# Patient Record
Sex: Male | Born: 1975 | Race: Black or African American | Hispanic: No | Marital: Single | Smoking: Current every day smoker
Health system: Southern US, Community
[De-identification: ages and names within clinical notes are randomized; demographics above are authoritative.]

---

## 2003-05-19 ENCOUNTER — Emergency Department (HOSPITAL_COMMUNITY): Admission: EM | Admit: 2003-05-19 | Discharge: 2003-05-19 | Payer: Self-pay | Admitting: Emergency Medicine

## 2004-02-28 ENCOUNTER — Emergency Department (HOSPITAL_COMMUNITY): Admission: AC | Admit: 2004-02-28 | Discharge: 2004-02-28 | Payer: Self-pay
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
## 2013-11-09 ENCOUNTER — Encounter (HOSPITAL_COMMUNITY): Payer: Self-pay | Admitting: Emergency Medicine

## 2013-11-09 ENCOUNTER — Emergency Department (HOSPITAL_COMMUNITY)
Admission: EM | Admit: 2013-11-09 | Discharge: 2013-11-10 | Disposition: A | Discharge status: Home / Self Care | Payer: Medicaid - Out of State | Attending: Emergency Medicine | Admitting: Emergency Medicine

## 2013-11-09 DIAGNOSIS — R112 Nausea with vomiting, unspecified: Secondary | ICD-10-CM | POA: Insufficient documentation

## 2013-11-09 DIAGNOSIS — F1010 Alcohol abuse, uncomplicated: Secondary | ICD-10-CM | POA: Insufficient documentation

## 2013-11-09 DIAGNOSIS — K2970 Gastritis, unspecified, without bleeding: Secondary | ICD-10-CM

## 2013-11-09 DIAGNOSIS — K2990 Gastroduodenitis, unspecified, without bleeding: Principal | ICD-10-CM

## 2013-11-09 DIAGNOSIS — F17200 Nicotine dependence, unspecified, uncomplicated: Secondary | ICD-10-CM | POA: Insufficient documentation

## 2013-11-09 LAB — URINALYSIS, ROUTINE W REFLEX MICROSCOPIC
Bilirubin Urine: NEGATIVE
GLUCOSE, UA: NEGATIVE mg/dL
KETONES UR: NEGATIVE mg/dL
Nitrite: NEGATIVE
PH: 6.5 (ref 5.0–8.0)
Protein, ur: NEGATIVE mg/dL
SPECIFIC GRAVITY, URINE: 1.025 (ref 1.005–1.030)
Urobilinogen, UA: 0.2 mg/dL (ref 0.0–1.0)

## 2013-11-09 LAB — COMPREHENSIVE METABOLIC PANEL
ALT: 23 U/L (ref 0–53)
AST: 27 U/L (ref 0–37)
Albumin: 4.5 g/dL (ref 3.5–5.2)
Alkaline Phosphatase: 49 U/L (ref 39–117)
BUN: 16 mg/dL (ref 6–23)
CO2: 23 mEq/L (ref 19–32)
Calcium: 9.9 mg/dL (ref 8.4–10.5)
Chloride: 94 mEq/L — ABNORMAL LOW (ref 96–112)
Creatinine, Ser: 1.06 mg/dL (ref 0.50–1.35)
GFR calc Af Amer: >90 mL/min (ref >90)
GFR calc non Af Amer: 89 mL/min — ABNORMAL LOW (ref >90)
Glucose, Bld: 134 mg/dL — ABNORMAL HIGH (ref 70–99)
Potassium: 3.4 mEq/L — ABNORMAL LOW (ref 3.7–5.3)
Sodium: 135 mEq/L — ABNORMAL LOW (ref 137–147)
Total Bilirubin: 0.8 mg/dL (ref 0.3–1.2)
Total Protein: 8.5 g/dL — ABNORMAL HIGH (ref 6.0–8.3)

## 2013-11-09 LAB — CBC WITH DIFFERENTIAL/PLATELET
BASOS ABS: 0 10*3/uL (ref 0.0–0.1)
BASOS PCT: 0 % (ref 0–1)
Eosinophils Absolute: 0.1 10*3/uL (ref 0.0–0.7)
Eosinophils Relative: 1 % (ref 0–5)
HEMATOCRIT: 46.1 % (ref 39.0–52.0)
Hemoglobin: 16.8 g/dL (ref 13.0–17.0)
Lymphocytes Relative: 19 % (ref 12–46)
Lymphs Abs: 1.7 10*3/uL (ref 0.7–4.0)
MCH: 33.7 pg (ref 26.0–34.0)
MCHC: 36.4 g/dL — AB (ref 30.0–36.0)
MCV: 92.6 fL (ref 78.0–100.0)
MONO ABS: 0.5 10*3/uL (ref 0.1–1.0)
Monocytes Relative: 5 % (ref 3–12)
NEUTROS ABS: 6.7 10*3/uL (ref 1.7–7.7)
NEUTROS PCT: 75 % (ref 43–77)
Platelets: 298 10*3/uL (ref 150–400)
RBC: 4.98 MIL/uL (ref 4.22–5.81)
RDW: 11.5 % (ref 11.5–15.5)
WBC: 8.9 10*3/uL (ref 4.0–10.5)

## 2013-11-09 LAB — LIPASE, BLOOD: Lipase: 31 U/L (ref 11–59)

## 2013-11-09 LAB — URINE MICROSCOPIC-ADD ON

## 2013-11-09 MED ORDER — ONDANSETRON 4 MG PO TBDP
8.0000 mg | ORAL_TABLET | Freq: Once | ORAL | Status: AC
  Administered 2013-11-09: 8 mg via ORAL
  Filled 2013-11-09: qty 2

## 2013-11-09 NOTE — ED Notes (Signed)
Urine collected and held until order placed.

## 2013-11-09 NOTE — ED Notes (Signed)
Pt alert, NAD, calm, interactive[facility]

## 2013-11-09 NOTE — ED Notes (Signed)
Dark and Light [place] tubes along with purple top tube collected and held until orders are placed.

## 2013-11-09 NOTE — ED Notes (Signed)
C/o diffuse abd "discomfort & irritation" (denies pain). Also nv. (denies: diarrhea, bleeding, back pain, urinary sx or fever), onset 2d ago, last BM yesterday (normal), last emesis 1700, x1 today, x2 yesterday), last marijuana this am, last ETOH 2d ago, last ate "oodles of noodles" this afternoon. Emesis "yellow". abd soft NT, BS present x4 quads, non-TTP. No h/o surgery. Pt arguing with family, family arguing with pt about leaving information out. Family answering questions for pt.

## 2013-11-09 NOTE — ED Notes (Signed)
abd pain since yesterday and he has vomited once

## 2013-11-10 MED ORDER — FAMOTIDINE 20 MG PO TABS: 20.0000 mg | ORAL_TABLET | Freq: Two times a day (BID) | ORAL | Status: DC

## 2013-11-10 MED ORDER — FAMOTIDINE 20 MG PO TABS
40.0000 mg | ORAL_TABLET | Freq: Once | ORAL | Status: AC
  Administered 2013-11-10: 40 mg via ORAL
  Filled 2013-11-10: qty 2

## 2013-11-10 MED ORDER — GI COCKTAIL ~~LOC~~
30.0000 mL | Freq: Once | ORAL | Status: AC
  Administered 2013-11-10: 30 mL via ORAL
  Filled 2013-11-10: qty 30

## 2013-11-10 MED ORDER — ONDANSETRON 8 MG PO TBDP: 8.0000 mg | ORAL_TABLET | Freq: Three times a day (TID) | ORAL | Status: DC | PRN

## 2013-11-10 NOTE — ED Provider Notes (Signed)
CSN: [id number]     Arrival date & time 11/09/13  2040 History   First MD Initiated Contact with Patient 11/09/13 2303     Chief Complaint  Patient presents with  . Abdominal Pain     (Consider location/radiation/quality/duration/timing/severity/associated sxs/prior Treatment) HPI 38 year old male presents to emergency room from home with complaint of nausea and vomiting.  For 2 days with abdominal discomfort, and indigestion.  Patient, reports he's had intermittent symptoms similar to this over the last several months.  Patient reports he does drink regularly, estimates out of a week span.  He drinks 3-4 days out of the week.  He is unsure if his nausea and vomiting.  Is associated with days that he drinks.  He has not tried any over-the-counter medications.  He does not see a Dr. on a regular basis.  Patient noted to have hypertension here today, denies any previous history of hypertension.  No family history of inflammatory bowel disease.  He reports some diarrhea today.  Patient received ODT Zofran, which he says improved symptoms greatly.  No fevers, chills, abdominal pain, chest pain, shortness of breath, urinary symptoms, sick contacts, travel, or unusual foods. History reviewed. No pertinent past medical history. History reviewed. No pertinent past surgical history. No family history on file. History  Substance Use Topics  . Smoking status: Current Every Day Smoker  . Smokeless tobacco: Not on file  . Alcohol Use: Yes    Review of Systems   See History of Present Illness; otherwise all other systems are reviewed and negative  Allergies  Review of patient's allergies indicates no known allergies.  Home Medications  No current outpatient prescriptions on file. BP 162/102  Pulse 82  Temp(Src) 98.1 F (36.7 C) (Oral)  Resp 16  Ht 5\' 7"  (1.702 m)  Wt 208 lb 11.2 oz (94.666 kg)  BMI 32.68 kg/m2  SpO2 96% Physical Exam  Nursing note and vitals reviewed. Constitutional: He is  oriented to person, place, and time. He appears well-developed and well-nourished. No distress.  HENT:  Head: Normocephalic and atraumatic.  Nose: Nose normal.  Mouth/Throat: Oropharynx is clear and moist.  Eyes: Conjunctivae and EOM are normal. Pupils are equal, round, and reactive to light.  Neck: Normal range of motion. Neck supple. No JVD present. No tracheal deviation present. No thyromegaly present.  Cardiovascular: Normal rate, regular rhythm, normal heart sounds and intact distal pulses.  Exam reveals no gallop and no friction rub.   No murmur heard. Pulmonary/Chest: Effort normal and breath sounds normal. No stridor. No respiratory distress. He has no wheezes. He has no rales. He exhibits no tenderness.  Abdominal: Soft. Bowel sounds are normal. He exhibits no distension and no mass. There is no tenderness. There is no rebound and no guarding.  Musculoskeletal: Normal range of motion. He exhibits no edema and no tenderness.  Lymphadenopathy:    He has no cervical adenopathy.  Neurological: He is alert and oriented to person, place, and time. He has normal reflexes. No cranial nerve deficit. He exhibits normal muscle tone. Coordination normal.  Skin: Skin is warm and dry. No rash noted. No erythema. No pallor.  Psychiatric: He has a normal mood and affect. His behavior is normal. Judgment and thought content normal.    ED Course  Procedures (including critical care time) Labs Review Labs Reviewed  CBC WITH DIFFERENTIAL - Abnormal; Notable for the following:    MCHC 36.4 (*)    All other components within normal limits  COMPREHENSIVE METABOLIC  PANEL - Abnormal; Notable for the following:    Sodium 135 (*)    Potassium 3.4 (*)    Chloride 94 (*)    Glucose, Bld 134 (*)    Total Protein 8.5 (*)    GFR calc non Af Amer 89 (*)    All other components within normal limits  URINALYSIS, ROUTINE W REFLEX MICROSCOPIC - Abnormal; Notable for the following:    Hgb urine dipstick SMALL  (*)    Leukocytes, UA SMALL (*)    All other components within normal limits  URINE MICROSCOPIC-ADD ON - Abnormal; Notable for the following:    Casts HYALINE CASTS (*)    All other components within normal limits  URINE CULTURE  LIPASE, BLOOD   Imaging Review No results found.   EKG Interpretation None      MDM   Final diagnoses:  Nausea and vomiting  Gastritis    38 year old male with intermittent nausea and vomiting, ongoing for last several months, lasting a day or two at a time.  It may be related to his alcohol consumption.  He may have an element of alcoholic gastritis.  Symptoms also may be simple gastroenteritis.  Labs are reassuring, plan to give GI cocktail and started on Pepcid, will give patient local primary care Dr. list.  Followup with to address, his hypertension.    Olivia Mackielga M Daimian Sudberry, MD 11/10/13 [phone]

## 2013-11-10 NOTE — Discharge Instructions (Signed)
Take medications as prescribed.  Avoid alcohol consumption.  Your blood pressure was elevated in the ER today, please followup with the health and wellness center listed above or one of the primary care doctors listed below for further evaluation of your blood pressure.   Gastritis, Adult Gastritis is soreness and swelling (inflammation) of the lining of the stomach. Gastritis can develop as a sudden onset (acute) or long-term (chronic) condition. If gastritis is not treated, it can lead to stomach bleeding and ulcers. CAUSES  Gastritis occurs when the stomach lining is weak or damaged. Digestive juices from the stomach then inflame the weakened stomach lining. The stomach lining may be weak or damaged due to viral or bacterial infections. One common bacterial infection is the Helicobacter pylori infection. Gastritis can also result from excessive alcohol consumption, taking certain medicines, or having too much acid in the stomach.  SYMPTOMS  In some cases, there are no symptoms. When symptoms are present, they may include:  Pain or a burning sensation in the upper abdomen.  Nausea.  Vomiting.  An uncomfortable feeling of fullness after eating. DIAGNOSIS  Your caregiver may suspect you have gastritis based on your symptoms and a physical exam. To determine the cause of your gastritis, your caregiver may perform the following:  Blood or stool tests to check for the H pylori bacterium.  Gastroscopy. A thin, flexible tube (endoscope) is passed down the esophagus and into the stomach. The endoscope has a light and camera on the end. Your caregiver uses the endoscope to view the inside of the stomach.  Taking a tissue sample (biopsy) from the stomach to examine under a microscope. TREATMENT  Depending on the cause of your gastritis, medicines may be prescribed. If you have a bacterial infection, such as an H pylori infection, antibiotics may be given. If your gastritis is caused by too much  acid in the stomach, H2 blockers or antacids may be given. Your caregiver may recommend that you stop taking aspirin, ibuprofen, or other nonsteroidal anti-inflammatory drugs (NSAIDs). HOME CARE INSTRUCTIONS  Only take over-the-counter or prescription medicines as directed by your caregiver.  If you were given antibiotic medicines, take them as directed. Finish them even if you start to feel better.  Drink enough fluids to keep your urine clear or pale yellow.  Avoid foods and drinks that make your symptoms worse, such as:  Caffeine or alcoholic drinks.  Chocolate.  Peppermint or mint flavorings.  Garlic and onions.  Spicy foods.  Citrus fruits, such as oranges, lemons, or limes.  Tomato-based foods such as sauce, chili, salsa, and pizza.  Fried and fatty foods.  Eat small, frequent meals instead of large meals. SEEK IMMEDIATE MEDICAL CARE IF:   You have black or dark red stools.  You vomit blood or material that looks like coffee grounds.  You are unable to keep fluids down.  Your abdominal pain gets worse.  You have a fever.  You do not feel better after 1 week.  You have any other questions or concerns. MAKE SURE YOU:  Understand these instructions.  Will watch your condition.  Will get help right away if you are not doing well or get worse. Document Released: 08/17/2001 Document Revised: 02/22/2012 Document Reviewed: 10/06/2011 [facility] Patient Information 2014 [place], [place].  Nausea and Vomiting Nausea is a sick feeling that often comes before throwing up (vomiting). Vomiting is a reflex where stomach contents come out of your mouth. Vomiting can cause severe loss of body fluids (dehydration). Children  and elderly adults can become dehydrated quickly, especially if they also have diarrhea. Nausea and vomiting are symptoms of a condition or disease. It is important to find the cause of your symptoms. CAUSES   Direct irritation of the stomach lining. This  irritation can result from increased acid production (gastroesophageal reflux disease), infection, food poisoning, taking certain medicines (such as nonsteroidal anti-inflammatory drugs), alcohol use, or tobacco use.  Signals from the brain.These signals could be caused by a headache, heat exposure, an inner ear disturbance, increased pressure in the brain from injury, infection, a tumor, or a concussion, pain, emotional stimulus, or metabolic problems.  An obstruction in the gastrointestinal tract (bowel obstruction).  Illnesses such as diabetes, hepatitis, gallbladder problems, appendicitis, kidney problems, cancer, sepsis, atypical symptoms of a heart attack, or eating disorders.  Medical treatments such as chemotherapy and radiation.  Receiving medicine that makes you sleep (general anesthetic) during surgery. DIAGNOSIS Your caregiver may ask for tests to be done if the problems do not improve after a few days. Tests may also be done if symptoms are severe or if the reason for the nausea and vomiting is not clear. Tests may include:  Urine tests.  Blood tests.  Stool tests.  Cultures (to look for evidence of infection).  X-rays or other imaging studies. Test results can help your caregiver make decisions about treatment or the need for additional tests. TREATMENT You need to stay well hydrated. Drink frequently but in small amounts.You may wish to drink water, sports drinks, clear broth, or eat frozen ice pops or gelatin dessert to help stay hydrated.When you eat, eating slowly may help prevent nausea.There are also some antinausea medicines that may help prevent nausea. HOME CARE INSTRUCTIONS   Take all medicine as directed by your caregiver.  If you do not have an appetite, do not force yourself to eat. However, you must continue to drink fluids.  If you have an appetite, eat a normal diet unless your caregiver tells you differently.  Eat a variety of complex carbohydrates  (rice, wheat, potatoes, bread), lean meats, yogurt, fruits, and vegetables.  Avoid high-fat foods because they are more difficult to digest.  Drink enough water and fluids to keep your urine clear or pale yellow.  If you are dehydrated, ask your caregiver for specific rehydration instructions. Signs of dehydration may include:  Severe thirst.  Dry lips and mouth.  Dizziness.  Dark urine.  Decreasing urine frequency and amount.  Confusion.  Rapid breathing or pulse. SEEK IMMEDIATE MEDICAL CARE IF:   You have blood or brown flecks (like coffee grounds) in your vomit.  You have black or bloody stools.  You have a severe headache or stiff neck.  You are confused.  You have severe abdominal pain.  You have chest pain or trouble breathing.  You do not urinate at least once every 8 hours.  You develop cold or clammy skin.  You continue to vomit for longer than 24 to 48 hours.  You have a fever. MAKE SURE YOU:   Understand these instructions.  Will watch your condition.  Will get help right away if you are not doing well or get worse. Document Released: 08/23/2005 Document Revised: 11/15/2011 Document Reviewed: 01/20/2011 [facility] Patient Information 2014 [place].   Emergency Department Resource Guide 1) Find a Doctor and Pay Out of Pocket Although you won't have to find out who is covered by your insurance plan, it is a good idea to ask around and get recommendations. You  will then need to call the office and see if the doctor you have chosen will accept you as a new patient and what types of options they offer for patients who are self-pay. Some doctors offer discounts or will set up payment plans for their patients who do not have insurance, but you will need to ask so you aren't surprised when you get to your appointment.  2) Contact Your Local Health Department Not all health departments have doctors that can see patients for sick visits, but many do, so  it is worth a call to see if yours does. If you don't know where your local health department is, you can check in your phone book. The CDC also has a tool to help you locate your state's health department, and many state websites also have listings of all of their local health departments.  3) Find a Walk-in Clinic If your illness is not likely to be very severe or complicated, you may want to try a walk in clinic. These are popping up all over the country in pharmacies, drugstores, and shopping centers. They're usually staffed by nurse practitioners or physician assistants that have been trained to treat common illnesses and complaints. They're usually fairly quick and inexpensive. However, if you have serious medical issues or chronic medical problems, these are probably not your best option.  No Primary Care Doctor: - Call Health Connect at  [phone] - they can help you locate a primary care doctor that  accepts your insurance, provides certain services, etc. - Physician Referral Service- [phone]  Chronic Pain Problems: Organization         Address  Phone   Notes  [facility]  [phone] Patients need to be referred by their primary care doctor.   Medication Assistance: Organization         Address  Phone   Notes  [facility] Medication [facility] [address] [place] [place], [place] [address] [phone] --Must be a resident of [facility] -- Must have NO insurance coverage whatsoever (no Medicaid/ Medicare, etc.) -- The pt. MUST have a primary care doctor that directs their care regularly and follows them in the community   MedAssist  [phone]   [employer]  [phone]    Agencies that provide inexpensive medical care: Organization         Address  Phone   Notes  [facility]  [phone]   [facility]    [phone]   [facility] [address] [place], [place] [address] [phone]   [facility] [address], [place] [phone]   Planned Parenthood    [phone]   [facility]    [phone]   [facility]  [address], [place] Phone:  [phone], Fax:  [phone] Hours of Operation:  9 am - 6 pm, M-F.  Also accepts Medicaid/Medicare and self-pay.  [facility]  [address], [place] Phone: [phone], Fax: [phone]. Hours of Operation:  8:30 am - 5:30 pm, M-F.  Also accepts Medicaid and self-pay.  [facility] [place] [address] [place] Phone: [phone]   [facility] [address] Natasha Bence [place], [place] [phone], Ext. [phone] Mondays & Thursdays: 7-9 AM.  First 15 patients are seen on a first come, first  serve basis.    Medicaid-accepting [facility] Providers:  Organization         Address  Phone   Notes  [facility] [address], [place] [phone] Also accepts self-pay patients.  [facility] [address] Laurell Josephs [place], [place]  [phone]   [facility] [address], [place] [phone]   [facility] [address], [place] [phone]   Renaye Rakers [address], [place]   [phone] Only accepts [place] Access [place] patients after they have their name applied to their card.   Self-Pay (no insurance) in [facility]:  Organization         Address  Phone   Notes  Sickle Cell Patients, [facility] [address] [place], [place] [phone]   [facility] [address] [place], [place] [phone]   [facility] [place]  [address], [place] [phone]   [facility]/Dr. Osei-Bonsu  [address] [place] or  [address], [place] [phone] Phone number for both [place] and [place] locations is the same.  [facility] [address] [place] [phone]   [facility] [address] [place] or [address] [phone]   [facility] [address], [place] [phone], phone; [phone], fax Sees patients 1st and 3rd Saturday of every month.  Must not qualify for public or private insurance (i.e. Medicaid, Medicare, [place] Health Choice, Veterans' Benefits)  Household income should be no more than 200% of the poverty level The clinic cannot treat you if you are pregnant or think you are pregnant  Sexually transmitted diseases are not treated at the clinic.    Dental Care: Organization         Address  Phone  Notes  [facility] [address] [place], [place] [phone] Accepts children up to age 27 who are enrolled in [place] or [place] Health Choice; pregnant women with a Medicaid card; and children who have applied for Medicaid or [place] Health Choice, but were declined, whose parents can pay a reduced fee at time of service.  [facility] of [facility]  [address], [place] [phone] Accepts children up to age 42 who are enrolled in [place] or [place] Health Choice; pregnant women with a Medicaid card; and children who have applied for Medicaid or [place] Health Choice, but were declined, whose parents can pay a reduced fee at time of service.  [facility]  [address] [place], [place] [phone] Patients are seen by appointment only. Walk-ins are not accepted. [facility] will see patients 77 years of age and older. Monday - Tuesday (8am-5pm) Most Wednesdays (8:30-5pm) $30 per visit, cash only  [facility] Adult Dental Access PROGRAM  [address], [facility] [phone] Patients are seen by appointment only. Walk-ins are not accepted. [facility] will see patients 32 years of age and older. One Wednesday Evening (Monthly: Volunteer Based).  $30 per visit, cash only  [employer]  [phone] for adults; Children under age 77, call Graduate Pediatric Dentistry at [phone]. Children aged 63-14, please call (  [phone]) [phone] to request a pediatric application.  Dental services are provided in all areas of dental care including fillings, crowns and bridges, complete and partial dentures, implants, gum treatment, root canals, and extractions. Preventive care is also provided. Treatment is provided to both adults and children. Patients are selected via a lottery and there is often a waiting list.   [facility] [address], [place]  [phone] [email or web address]   [facility] [address] [place], [place] [phone], Ext. [phone] Second and Fourth Thursday of each month, opens at 6:30 AM; Clinic ends at 9 AM.  Patients are seen on a first-come first-served basis, and a limited number are seen during each clinic.   [facility]  [address] Ether Griffins [place], [place] [phone]   Eligibility Requirements You must have lived in [place], [place], or [place] counties for at least the last three months.   You cannot be eligible for state or federal sponsored [place], including [employer], [place], or [employer].   You generally cannot be eligible for healthcare insurance through your employer.    How to apply: Eligibility screenings are held every Tuesday and Wednesday afternoon from 1:00 pm until 4:00 pm. You do not need an appointment for the interview!  [facility] [address] [place], [place] [phone]   [facility] Health Department  [phone]   [facility] Health Department  [phone]   [facility]  Health Department  [phone]    Behavioral Health Resources in the Community: Intensive Outpatient Programs Organization         Address  Phone  Notes  [facility] Services [address], [place], [place] [phone]   [facility] Outpatient [address], [place], [place] [phone]   [facility] [address], [place], [place]  [phone]   [facility] Mental Health [address],  [place], [place] [phone] or [phone]   Substance Abuse Resources Organization         Address  Phone  Notes  [facility] Drug Services  [phone]   [facility]  [phone]   [facility]  [phone]   Floydene Flock  [phone]   Residential & Outpatient Substance Abuse Program  [phone]   Psychological Services Organization         Address  Phone  Notes  [facility] Behavioral Health  [phone]   [facility] Services  [phone]   [facility] Mental Health [address], [place] [phone] or [phone]    [place] Crisis Teams Organization         Address  Phone  Notes  [facility], [place] Crisis Care Unit  [phone]   [facility]  [address] [place], [place] [phone]   Doristine Locks [address] [place] [phone]    Self-Help/Support Groups Organization         Address  Phone             Notes  [facility] [place] - variety of support groups  336- I7437963 Call for more information  Narcotics Anonymous (NA), [facility] [address], [employer] [place]  2 meetings at this location   Statistician         Address  Phone  Notes  [facility] [address],    [place]  [phone]   [facility]  [id number], [place], [place] [phone]   [facility] [address] [place], [place] [phone]  Admissions: 8am-3pm M-F  [facility] [address]    [place], [place] [phone]   [facility] [address] [place], [place], [place] [phone]   [facility] [address]  [place], [place] [phone]   [facility] - Intensive Outpatient [address] Laurell Josephs [address], [place], [place] [phone]   [facility] ([facility] [address] [place]  [place], [place] [phone] or [phone]   [facility] ([facility]) [address], [place], [place] [phone] Accepts Medicaid  [facility] [place] [address] [place] [address],  [place] [phone] Substance Abuse/Addiction Treatment   [facility] [place] [facility] Organization         Address  Phone  Notes  [facility]  [phone]   Angie Fava, PhD [address] Ervin Knack [place], [place]   [phone] or [phone]   [facility] Behavioral   [address] [place], [place] [phone]   [facility] [address], [place], [place] [phone] Insurance/Medicaid/sponsorship through [facility] and Families [address]                                    [place], [place] [phone] Therapy/tele-psych/case  [facility] [address], [place] [phone]    Dr. Lolly Mustache  [phone]   [facility]  [employer] [facility] Dept. 1) [address] [place] 2) [address] [place] 3)  [address], [place] [phone]  [phone]   [facility] Child Abuse Hotline [phone]) [phone] or [phone] (After Hours)

## 2013-11-11 LAB — URINE CULTURE
Colony Count: NO GROWTH
Culture: NO GROWTH

## 2014-01-22 ENCOUNTER — Encounter (HOSPITAL_COMMUNITY): Payer: Self-pay | Admitting: Emergency Medicine

## 2014-01-22 ENCOUNTER — Emergency Department (HOSPITAL_COMMUNITY)
Admission: EM | Admit: 2014-01-22 | Discharge: 2014-01-22 | Disposition: A | Discharge status: Home / Self Care | Payer: Medicaid - Out of State | Attending: Emergency Medicine | Admitting: Emergency Medicine

## 2014-01-22 DIAGNOSIS — F17200 Nicotine dependence, unspecified, uncomplicated: Secondary | ICD-10-CM | POA: Insufficient documentation

## 2014-01-22 DIAGNOSIS — Z79899 Other long term (current) drug therapy: Secondary | ICD-10-CM | POA: Insufficient documentation

## 2014-01-22 DIAGNOSIS — K1379 Other lesions of oral mucosa: Secondary | ICD-10-CM

## 2014-01-22 DIAGNOSIS — K055 Other periodontal diseases: Secondary | ICD-10-CM | POA: Insufficient documentation

## 2014-01-22 NOTE — ED Notes (Addendum)
Pt started vomiting last night after eating.  Vomited x 1. Pt felt it was b/c he ate and went to bed.  Today he went to work.  Pt noted blood on his lip.  No mouth trauma.  No additional vomiting. No fever, diarrhea.  Pt denies cough.  No pain in neck.  No hx of seizures.  No difficulty with ears.  MD at bedside completing exam.  Pt denies black stools recently.

## 2014-01-22 NOTE — Progress Notes (Signed)
[facility] provided pt with a list of primary care resources and a [facility] [place] Card application to help patient establish primary care.  °

## 2014-01-22 NOTE — Discharge Instructions (Signed)
Drink plenty of fluids.  Return to the emergency department if you develop worsening of your symptoms, or any other new and concerning symptoms.

## 2014-01-22 NOTE — ED Provider Notes (Signed)
CSN: [id number]     Arrival date & time 01/22/14  0854 History   First MD Initiated Contact with Patient 01/22/14 [phone]     Chief Complaint  Patient presents with  . Hemoptysis     (Consider location/radiation/quality/duration/timing/severity/associated sxs/prior Treatment) HPI Comments: Patient is a 38 year old male with no significant past medical history. He presents today with complaints of spitting out blood. He was at work today and a Radio broadcast assistantcoworker informed him he had what appeared to be blood on his left lower lip. He states he went to the bathroom and spit and there was a slight amount of blood in it. He denies any other complaints. He denies vomiting, mouth sores, mouth pain, sinus congestion, cough, or any other complaint he states he feels just fine. This was a one-time occurrence and has not recurred since. He stated he panicked and called the ambulance and was transported here for evaluation. Denies any abdominal pain or dark stools.  The history is provided by the patient.    History reviewed. No pertinent past medical history. History reviewed. No pertinent past surgical history. History reviewed. No pertinent family history. History  Substance Use Topics  . Smoking status: Current Every Day Smoker  . Smokeless tobacco: Not on file  . Alcohol Use: Yes    Review of Systems  All other systems reviewed and are negative.     Allergies  Review of patient's allergies indicates no known allergies.  Home Medications   Prior to Admission medications   Medication Sig Start Date End Date Taking? Authorizing Provider  famotidine (PEPCID) 20 MG tablet Take 1 tablet (20 mg total) by mouth 2 (two) times daily. 11/10/13   Olivia Mackielga M Otter, MD  ondansetron (ZOFRAN ODT) 8 MG disintegrating tablet Take 1 tablet (8 mg total) by mouth every 8 (eight) hours as needed for nausea or vomiting. 11/10/13   Olivia Mackielga M Otter, MD   BP 145/105  Pulse 66  Temp(Src) 98 F (36.7 C) (Oral) Physical Exam   Nursing note and vitals reviewed. Constitutional: He is oriented to person, place, and time. He appears well-developed and well-nourished. No distress.  HENT:  Head: Normocephalic and atraumatic.  Mouth/Throat: Oropharynx is clear and moist.  The oropharynx appears completely normal. The entire inside of the mouth was inspected including becomes, cheeks, hard and soft palate, tongue, and underside of the tongue. I was unable to identify any bleeding site or lesions.  Neck: Normal range of motion. Neck supple.  Cardiovascular: Normal rate, regular rhythm and normal heart sounds.   No murmur heard. Pulmonary/Chest: Effort normal and breath sounds normal. No respiratory distress. He has no wheezes.  Abdominal: Soft. Bowel sounds are normal. He exhibits no distension. There is no tenderness.  Musculoskeletal: Normal range of motion. He exhibits no edema.  Neurological: He is alert and oriented to person, place, and time.  Skin: Skin is warm and dry. He is not diaphoretic.    ED Course  Procedures (including critical care time) Labs Review Labs Reviewed - No data to display  Imaging Review No results found.   EKG Interpretation None      MDM   Final diagnoses:  None    Patient presents here with complaints of blood on his lip, for which I am unable to find a reason. He has no other complaints and vital signs and physical examination are normal. I do not feel as though any workup is indicated and do not feel as though there is any indication for  endoscopy or other invasive procedure without more significant symptoms. I feel as though discharge is appropriate and taking a watch and wait approach to see if this recurs is in his best interest. He understands to return if his symptoms recur or significantly worsen.    Geoffery Lyonsouglas Karri Kallenbach, MD 01/22/14 1021

## 2015-04-14 ENCOUNTER — Emergency Department (HOSPITAL_COMMUNITY): Payer: Medicaid - Out of State

## 2015-04-14 ENCOUNTER — Encounter (HOSPITAL_COMMUNITY): Payer: Self-pay | Admitting: Neurology

## 2015-04-14 ENCOUNTER — Emergency Department (HOSPITAL_COMMUNITY)
Admission: EM | Admit: 2015-04-14 | Discharge: 2015-04-14 | Disposition: A | Discharge status: Home / Self Care | Payer: Medicaid - Out of State | Attending: Emergency Medicine | Admitting: Emergency Medicine

## 2015-04-14 DIAGNOSIS — Z720 Tobacco use: Secondary | ICD-10-CM | POA: Insufficient documentation

## 2015-04-14 DIAGNOSIS — Y9389 Activity, other specified: Secondary | ICD-10-CM | POA: Insufficient documentation

## 2015-04-14 DIAGNOSIS — Y9289 Other specified places as the place of occurrence of the external cause: Secondary | ICD-10-CM | POA: Insufficient documentation

## 2015-04-14 DIAGNOSIS — S93401A Sprain of unspecified ligament of right ankle, initial encounter: Secondary | ICD-10-CM | POA: Insufficient documentation

## 2015-04-14 DIAGNOSIS — Y998 Other external cause status: Secondary | ICD-10-CM | POA: Insufficient documentation

## 2015-04-14 MED ORDER — MELOXICAM 15 MG PO TABS: 15.0000 mg | ORAL_TABLET | Freq: Every day | ORAL | Status: AC

## 2015-04-14 MED ORDER — CYCLOBENZAPRINE HCL 10 MG PO TABS: 10.0000 mg | ORAL_TABLET | Freq: Two times a day (BID) | ORAL | Status: DC | PRN

## 2015-04-14 NOTE — Discharge Instructions (Signed)

## 2015-04-14 NOTE — ED Notes (Signed)
Pt reports right ankle pain today after rolling his ankle while helping someone move. 2 years ago he broke his ankle and took the cast off early thinks his ankle is weak since

## 2015-04-14 NOTE — ED Provider Notes (Signed)
CSN: [id number]     Arrival date & time 04/14/15  1603 History  This chart was scribed for non-physician practitioner, Dorena Dew. [name], PA-C working with Mancel Bale, MD by Gwenyth Ober, ED scribe. This patient was seen in room TR02C/TR02C and the patient's care was started at 5:51 PM   Chief Complaint  Patient presents with  . Ankle Pain   The history is provided by the patient. No language interpreter was used.   HPI Comments: Jeffrey Berg is a 39 y.o. male who presents to the Emergency Department complaining of constant, moderate right ankle pain that started today. Pt states swelling and tingling of his right foot as associated symptoms. His pain becomes worse with walking. Pt works as a Scientist, forensic. He reports onset of pain started after he inverted his ankle while walking down the stairs backwards. Pt had a distal fibula fracture 2 years ago and is concerned he re-fractured his ankle. Pt denies numbness.  History reviewed. No pertinent past medical history. History reviewed. No pertinent past surgical history. No family history on file. History  Substance Use Topics  . Smoking status: Current Every Day Smoker  . Smokeless tobacco: Not on file  . Alcohol Use: Yes    Review of Systems  Musculoskeletal: Positive for joint swelling, arthralgias and gait problem.  Skin: Negative for wound.  Neurological: Negative for numbness.  All other systems reviewed and are negative.   Allergies  Review of patient's allergies indicates no known allergies.  Home Medications   Prior to Admission medications   Medication Sig Start Date End Date Taking? Authorizing Provider  cyclobenzaprine (FLEXERIL) 10 MG tablet Take 1 tablet (10 mg total) by mouth 2 (two) times daily as needed for muscle spasms. 04/14/15   Aaliayah Miao [name], PA-C  meloxicam (MOBIC) 15 MG tablet Take 1 tablet (15 mg total) by mouth daily. 04/14/15   Leaann Nevils [name], PA-C   BP 141/101 mmHg  Pulse 92  Temp(Src) 98.2 F (36.8 C)  (Oral)  Resp 16  Ht 5\' 7"  (1.702 m)  Wt 210 lb (95.255 kg)  BMI 32.88 kg/m2  SpO2 94% Physical Exam  Constitutional: He appears well-developed and well-nourished. No distress.  HENT:  Head: Normocephalic and atraumatic.  Eyes: Conjunctivae and EOM are normal.  Neck: Neck supple. No tracheal deviation present.  Cardiovascular: Normal rate.   Pulmonary/Chest: Effort normal. No respiratory distress.  Musculoskeletal:       Right ankle: He exhibits decreased range of motion (due to pain) and swelling. He exhibits no ecchymosis, no deformity, no laceration and normal pulse. Tenderness. Lateral malleolus and medial malleolus tenderness found. Achilles tendon normal.  symmetrical pedal pulses  Skin: Skin is warm and dry.  Psychiatric: He has a normal mood and affect. His behavior is normal.  Nursing note and vitals reviewed.   ED Course  Procedures   DIAGNOSTIC STUDIES: Oxygen Saturation is 94% on RA, adequate by my interpretation.    COORDINATION OF CARE: 5:55 PM Discussed x-ray results and treatment plan with pt which includes Mobic, a muscle relaxer, crutches and an ASO wrap and RICE. Will refer to orthopedist, if needed. Pt agreed to plan.   Labs Review Labs Reviewed - No data to display  Imaging Review Dg Ankle Complete Right  04/14/2015   CLINICAL DATA:  Initial encounter for fall while walking yesterday with swelling and pain in the lateral ankle. Reports a history of ankle fracture 1 year ago.  EXAM: RIGHT ANKLE - COMPLETE 3+ VIEW  COMPARISON:  None.  FINDINGS: Three views study shows no fracture. No subluxation or dislocation. Trabecular distortion in the distal fibula may be related to healed fracture. Ankle mortise is preserved.  IMPRESSION: Negative.   Electronically Signed   By: Kennith Center M.D.   On: 04/14/2015 16:42     EKG Interpretation None      MDM   Final diagnoses:  Ankle sprain, right, initial encounter    Medications - No data to display  39  y.o.[name]'s evaluation in the Emergency Department is complete. It has been determined that no acute conditions requiring further emergency intervention are present at this time. The patient/guardian have been advised of the diagnosis and plan. We have discussed signs and symptoms that warrant return to the ED, such as changes or worsening in symptoms.  Vital signs are stable at discharge. Filed Vitals:   04/14/15 1608  BP: 141/101  Pulse: 92  Temp: 98.2 F (36.8 C)  Resp: 16    Patient/guardian has voiced understanding and agreed to follow-up with the PCP or specialist.   I personally performed the services described in this documentation, which was scribed in my presence. The recorded information has been reviewed and is accurate.    Marlon Pel, PA-C 04/14/15 1805  Marlon Pel, PA-C 04/14/15 1806  Mancel Bale, MD 04/15/15 [phone]

## 2015-06-19 ENCOUNTER — Encounter (HOSPITAL_COMMUNITY): Payer: Self-pay | Admitting: Emergency Medicine

## 2015-06-19 ENCOUNTER — Emergency Department (HOSPITAL_COMMUNITY)
Admission: EM | Admit: 2015-06-19 | Discharge: 2015-06-19 | Disposition: A | Discharge status: Home / Self Care | Payer: Medicaid - Out of State | Attending: Emergency Medicine | Admitting: Emergency Medicine

## 2015-06-19 DIAGNOSIS — Z791 Long term (current) use of non-steroidal anti-inflammatories (NSAID): Secondary | ICD-10-CM | POA: Insufficient documentation

## 2015-06-19 DIAGNOSIS — Y998 Other external cause status: Secondary | ICD-10-CM | POA: Diagnosis not present

## 2015-06-19 DIAGNOSIS — Y92410 Unspecified street and highway as the place of occurrence of the external cause: Secondary | ICD-10-CM | POA: Insufficient documentation

## 2015-06-19 DIAGNOSIS — Y9389 Activity, other specified: Secondary | ICD-10-CM | POA: Insufficient documentation

## 2015-06-19 DIAGNOSIS — S199XXA Unspecified injury of neck, initial encounter: Secondary | ICD-10-CM | POA: Diagnosis present

## 2015-06-19 DIAGNOSIS — S161XXA Strain of muscle, fascia and tendon at neck level, initial encounter: Secondary | ICD-10-CM | POA: Insufficient documentation

## 2015-06-19 DIAGNOSIS — S39012A Strain of muscle, fascia and tendon of lower back, initial encounter: Secondary | ICD-10-CM

## 2015-06-19 DIAGNOSIS — Z720 Tobacco use: Secondary | ICD-10-CM | POA: Diagnosis not present

## 2015-06-19 MED ORDER — CYCLOBENZAPRINE HCL 10 MG PO TABS: 10.0000 mg | ORAL_TABLET | Freq: Two times a day (BID) | ORAL | Status: AC | PRN

## 2015-06-19 MED ORDER — HYDROCODONE-ACETAMINOPHEN 5-325 MG PO TABS: 1.0000 | ORAL_TABLET | Freq: Four times a day (QID) | ORAL | Status: DC | PRN
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
## 2015-09-12 ENCOUNTER — Encounter (HOSPITAL_COMMUNITY): Payer: Self-pay | Admitting: Emergency Medicine

## 2015-09-12 ENCOUNTER — Emergency Department (INDEPENDENT_AMBULATORY_CARE_PROVIDER_SITE_OTHER)
Admission: EM | Admit: 2015-09-12 | Discharge: 2015-09-12 | Disposition: A | Discharge status: Home / Self Care | Payer: Self-pay | Source: Home / Self Care | Attending: Family Medicine | Admitting: Family Medicine

## 2015-09-12 DIAGNOSIS — R110 Nausea: Secondary | ICD-10-CM

## 2015-09-12 DIAGNOSIS — W108XXA Fall (on) (from) other stairs and steps, initial encounter: Secondary | ICD-10-CM

## 2015-09-12 DIAGNOSIS — S20229A Contusion of unspecified back wall of thorax, initial encounter: Secondary | ICD-10-CM

## 2015-09-12 DIAGNOSIS — M546 Pain in thoracic spine: Secondary | ICD-10-CM

## 2015-09-12 MED ORDER — DICLOFENAC SODIUM 1 % TD GEL: 1.0000 "application " | Freq: Four times a day (QID) | TRANSDERMAL | Status: AC

## 2015-09-12 MED ORDER — TRAMADOL HCL 50 MG PO TABS: 50.0000 mg | ORAL_TABLET | Freq: Four times a day (QID) | ORAL | Status: DC | PRN

## 2015-09-12 MED ORDER — NAPROXEN 375 MG PO TABS: 375.0000 mg | ORAL_TABLET | Freq: Two times a day (BID) | ORAL | Status: AC
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
## 2015-09-12 NOTE — Discharge Instructions (Signed)
Back Pain, Adult °Back pain is very common in adults. The cause of back pain is rarely dangerous and the pain often gets better over time. The cause of your back pain may not be known. Some common causes of back pain include: °· Strain of the muscles or ligaments supporting the spine. °· Wear and tear (degeneration) of the spinal disks. °· Arthritis. °· Direct injury to the back. °For many people, back pain may return. Since back pain is rarely dangerous, most people can learn to manage this condition on their own. °HOME CARE INSTRUCTIONS °Watch your back pain for any changes. The following actions may help to lessen any discomfort you are feeling: °· Remain active. It is stressful on your back to sit or stand in one place for long periods of time. Do not sit, drive, or stand in one place for more than 30 minutes at a time. Take short walks on even surfaces as soon as you are able. Try to increase the length of time you walk each day. °· Exercise regularly as directed by your health care provider. Exercise helps your back heal faster. It also helps avoid future injury by keeping your muscles strong and flexible. °· Do not stay in bed. Resting more than 1-2 days can delay your recovery. °· Pay attention to your body when you bend and lift. The most comfortable positions are those that put less stress on your recovering back. Always use proper lifting techniques, including: °· Bending your knees. °· Keeping the load close to your body. °· Avoiding twisting. °· Find a comfortable position to sleep. Use a firm mattress and lie on your side with your knees slightly bent. If you lie on your back, put a pillow under your knees. °· Avoid feeling anxious or stressed. Stress increases muscle tension and can worsen back pain. It is important to recognize when you are anxious or stressed and learn ways to manage it, such as with exercise. °· Take medicines only as directed by your health care provider. Over-the-counter  medicines to reduce pain and inflammation are often the most helpful. Your health care provider may prescribe muscle relaxant drugs. These medicines help dull your pain so you can more quickly return to your normal activities and healthy exercise. °· Apply ice to the injured area: °· Put ice in a plastic bag. °· Place a towel between your skin and the bag. °· Leave the ice on for 20 minutes, 2-3 times a day for the first 2-3 days. After that, ice and heat may be alternated to reduce pain and spasms. °· Maintain a healthy weight. Excess weight puts extra stress on your back and makes it difficult to maintain good posture. °SEEK MEDICAL CARE IF: °· You have pain that is not relieved with rest or medicine. °· You have increasing pain going down into the legs or buttocks. °· You have pain that does not improve in one week. °· You have night pain. °· You lose weight. °· You have a fever or chills. °SEEK IMMEDIATE MEDICAL CARE IF:  °· You develop new bowel or bladder control problems. °· You have unusual weakness or numbness in your arms or legs. °· You develop nausea or vomiting. °· You develop abdominal pain. °· You feel faint. °  °This information is not intended to replace advice given to you by your health care provider. Make sure you discuss any questions you have with your health care provider. °  °Document Released: 08/23/2005 Document Revised: 09/13/2014 Document Reviewed: 12/25/2013 °Elsevier Interactive Patient Education ©2016 Else[employer]   Inc[employer]  Contusion A contusion is a deep bruise. Contusions are the result of a blunt injury to tissues and muscle fibers under the skin. The injury causes bleeding under the skin. The skin overlying the contusion may turn blue, purple, or yellow. Minor injuries will give you a painless contusion, but more severe contusions may stay painful and swollen for a few weeks.  CAUSES  This condition is usually caused by a blow, trauma, or direct force to an area of the body. SYMPTOMS   Symptoms of this condition include:  Swelling of the injured area.  Pain and tenderness in the injured area.  Discoloration. The area may have redness and then turn blue, purple, or yellow. DIAGNOSIS  This condition is diagnosed based on a physical exam and medical history. An X-ray, CT scan, or MRI may be needed to determine if there are any associated injuries, such as broken bones (fractures). TREATMENT  Specific treatment for this condition depends on what area of the body was injured. In general, the best treatment for a contusion is resting, icing, applying pressure to (compression), and elevating the injured area. This is often called the RICE strategy. Over-the-counter anti-inflammatory medicines may also be recommended for pain control.  HOME CARE INSTRUCTIONS   Rest the injured area.  If directed, apply ice to the injured area:  Put ice in a plastic bag.  Place a towel between your skin and the bag.  Leave the ice on for 20 minutes, 2-3 times per day.  If directed, apply light compression to the injured area using an elastic bandage. Make sure the bandage is not wrapped too tightly. Remove and reapply the bandage as directed by your health care provider.  If possible, raise (elevate) the injured area above the level of your heart while you are sitting or lying down.  Take over-the-counter and prescription medicines only as told by your health care provider. SEEK MEDICAL CARE IF:  Your symptoms do not improve after several days of treatment.  Your symptoms get worse.  You have difficulty moving the injured area. SEEK IMMEDIATE MEDICAL CARE IF:   You have severe pain.  You have numbness in a hand or foot.  Your hand or foot turns pale or cold.   This information is not intended to replace advice given to you by your health care provider. Make sure you discuss any questions you have with your health care provider.   Document Released: 06/02/2005 Document Revised:  05/14/2015 Document Reviewed: 01/08/2015 Elsevier Interactive Patient Education 2016 [employer]  Nausea, Adult Nausea is the feeling that you have an upset stomach or have to vomit. Nausea by itself is not likely a serious concern, but it may be an early sign of more serious medical problems. As nausea gets worse, it can lead to vomiting. If vomiting develops, there is the risk of dehydration.  CAUSES   Viral infections.  Food poisoning.  Medicines.  Pregnancy.  Motion sickness.  Migraine headaches.  Emotional distress.  Severe pain from any source.  Alcohol intoxication. HOME CARE INSTRUCTIONS  Get plenty of rest.  Ask your caregiver about specific rehydration instructions.  Eat small amounts of food and sip liquids more often.  Take all medicines as told by your caregiver. SEEK MEDICAL CARE IF:  You have not improved after 2 days, or you get worse.  You have a headache. SEEK IMMEDIATE MEDICAL CARE IF:   You have a fever.  You faint.  You keep vomiting or have blood  in your vomit.  You are extremely weak or dehydrated.  You have dark or bloody stools.  You have severe chest or abdominal pain. MAKE SURE YOU:  Understand these instructions.  Will watch your condition.  Will get help right away if you are not doing well or get worse.   This information is not intended to replace advice given to you by your health care provider. Make sure you discuss any questions you have with your health care provider.   Document Released: 09/30/2004 Document Revised: 09/13/2014 Document Reviewed: 05/05/2011 Elsevier Interactive Patient Education 2016 Else[employer] Inc.  Thoracic Strain Thoracic strain is an injury to the muscles or tendons that attach to the upper back. A strain can be mild or severe. A mild strain may take only 1-2 weeks to heal. A severe strain involves torn muscles or tendons, so it may take 6-8 weeks to heal. HOME CARE  Rest as needed. Limit  your activity as told by your doctor.  If directed, put ice on the injured area:  Put ice in a plastic bag.  Place a towel between your skin and the bag.  Leave the ice on for 20 minutes, 2-3 times per day.  Take over-the-counter and prescription medicines only as told by your doctor.  Begin doing exercises as told by your doctor or physical therapist.  Warm up before being active.  Bend your knees before you lift heavy objects.  Keep all follow-up visits as told by your doctor. This is important. GET HELP IF:  Your pain is not helped by medicine.  Your pain, bruising, or swelling is getting worse.  You have a fever. GET HELP RIGHT AWAY IF:  You have shortness of breath.  You have chest pain.  You have weakness or loss of feeling (numbness) in your legs.  You cannot control when you pee (urinate).   This information is not intended to replace advice given to you by your health care provider. Make sure you discuss any questions you have with your health care provider.   Document Released: 02/09/2008 Document Revised: 05/14/2015 Document Reviewed: 10/17/2014 Elsevier Interactive Patient Education [employer]
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
## 2015-09-20 ENCOUNTER — Emergency Department (HOSPITAL_COMMUNITY)
Admission: EM | Admit: 2015-09-20 | Discharge: 2015-09-20 | Disposition: A | Discharge status: Home / Self Care | Payer: Self-pay | Attending: Emergency Medicine | Admitting: Emergency Medicine

## 2015-09-20 ENCOUNTER — Encounter (HOSPITAL_COMMUNITY): Payer: Self-pay | Admitting: Emergency Medicine

## 2015-09-20 DIAGNOSIS — T22231A Burn of second degree of right upper arm, initial encounter: Secondary | ICD-10-CM | POA: Insufficient documentation

## 2015-09-20 DIAGNOSIS — T23261A Burn of second degree of back of right hand, initial encounter: Secondary | ICD-10-CM | POA: Insufficient documentation

## 2015-09-20 DIAGNOSIS — X19XXXA Contact with other heat and hot substances, initial encounter: Secondary | ICD-10-CM | POA: Insufficient documentation

## 2015-09-20 DIAGNOSIS — Z791 Long term (current) use of non-steroidal anti-inflammatories (NSAID): Secondary | ICD-10-CM | POA: Insufficient documentation

## 2015-09-20 DIAGNOSIS — T3111 Burns involving 10-19% of body surface with 10-19% third degree burns: Secondary | ICD-10-CM

## 2015-09-20 DIAGNOSIS — Y9389 Activity, other specified: Secondary | ICD-10-CM | POA: Insufficient documentation

## 2015-09-20 DIAGNOSIS — T2122XA Burn of second degree of abdominal wall, initial encounter: Secondary | ICD-10-CM | POA: Insufficient documentation

## 2015-09-20 DIAGNOSIS — F17210 Nicotine dependence, cigarettes, uncomplicated: Secondary | ICD-10-CM | POA: Insufficient documentation

## 2015-09-20 DIAGNOSIS — Y999 Unspecified external cause status: Secondary | ICD-10-CM | POA: Insufficient documentation

## 2015-09-20 DIAGNOSIS — T22211A Burn of second degree of right forearm, initial encounter: Secondary | ICD-10-CM | POA: Insufficient documentation

## 2015-09-20 DIAGNOSIS — Z23 Encounter for immunization: Secondary | ICD-10-CM | POA: Insufficient documentation

## 2015-09-20 DIAGNOSIS — T2029XA Burn of second degree of multiple sites of head, face, and neck, initial encounter: Secondary | ICD-10-CM | POA: Insufficient documentation

## 2015-09-20 DIAGNOSIS — Y9289 Other specified places as the place of occurrence of the external cause: Secondary | ICD-10-CM | POA: Insufficient documentation

## 2015-09-20 MED ORDER — SILVER SULFADIAZINE 1 % EX CREA
TOPICAL_CREAM | Freq: Once | CUTANEOUS | Status: AC
  Administered 2015-09-20: 16:00:00 via TOPICAL
  Filled 2015-09-20: qty 85

## 2015-09-20 MED ORDER — OXYCODONE-ACETAMINOPHEN 5-325 MG PO TABS
1.0000 | ORAL_TABLET | Freq: Once | ORAL | Status: AC
  Administered 2015-09-20: 1 via ORAL
  Filled 2015-09-20: qty 1

## 2015-09-20 MED ORDER — BACITRACIN ZINC 500 UNIT/GM EX OINT
TOPICAL_OINTMENT | Freq: Two times a day (BID) | CUTANEOUS | Status: DC
  Administered 2015-09-20: 1 via TOPICAL
  Filled 2015-09-20: qty 0.9

## 2015-09-20 MED ORDER — TETANUS-DIPHTH-ACELL PERTUSSIS 5-2.5-18.5 LF-MCG/0.5 IM SUSP
0.5000 mL | Freq: Once | INTRAMUSCULAR | Status: AC
  Administered 2015-09-20: 0.5 mL via INTRAMUSCULAR
  Filled 2015-09-20: qty 0.5

## 2015-09-20 MED ORDER — OXYCODONE-ACETAMINOPHEN 5-325 MG PO TABS: 1.0000 | ORAL_TABLET | ORAL | Status: AC | PRN
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
## 2015-09-20 NOTE — Discharge Instructions (Signed)
Use the bacitracin on your face and abdomen and use the cream you were using in addition. Use the silvadene cream on your hand and arm. Return if symptoms worsen. Do not take the narcotic if driving as it will make you sleepy.    You may take ibuprofen in addition to the medication we prescribe.

## 2015-09-20 NOTE — ED Notes (Signed)
Pt ambulates independently at time of discharge. Discharge and follow up instructions reviewed with pt and family. No additional questions or concerns. RX x 1 given.
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
## 2015-09-25 ENCOUNTER — Encounter (HOSPITAL_COMMUNITY): Payer: Self-pay

## 2015-09-25 ENCOUNTER — Emergency Department (INDEPENDENT_AMBULATORY_CARE_PROVIDER_SITE_OTHER)
Admission: EM | Admit: 2015-09-25 | Discharge: 2015-09-25 | Disposition: A | Discharge status: Home / Self Care | Payer: Self-pay | Source: Home / Self Care | Attending: Family Medicine | Admitting: Family Medicine

## 2015-09-25 DIAGNOSIS — Z09 Encounter for follow-up examination after completed treatment for conditions other than malignant neoplasm: Secondary | ICD-10-CM

## 2015-09-25 MED ORDER — OXYCODONE-ACETAMINOPHEN 5-325 MG PO TABS: 2.0000 | ORAL_TABLET | ORAL | Status: AC | PRN
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
## 2015-09-25 NOTE — Discharge Instructions (Signed)
Burn Care °Burns hurt your skin. When your skin is hurt, it is easier to get an infection. Follow your doctor's directions to help prevent an infection. °HOME CARE °· Wash your hands well before you change your bandage. °· Change your bandage as often as told by your doctor. °¨ Remove the old bandage. If the bandage sticks, soak it off with cool, clean water. °¨ Gently clean the burn with mild soap and water. °¨ Pat the burn dry with a clean, dry cloth. °¨ Put a thin layer of medicated cream on the burn. °¨ Put a clean bandage on as told by your doctor. °¨ Keep the bandage clean and dry. °· Raise (elevate) the burn for the first 24 hours. After that, follow your doctor's directions. °· Only take medicine as told by your doctor. °GET HELP RIGHT AWAY IF:  °· You have too much pain. °· The skin near the burn is red, tender, puffy (swollen), or has red streaks. °· The burn area has yellowish white fluid (pus) or a bad smell coming from it. °· You have a fever. °MAKE SURE YOU:  °· Understand these instructions. °· Will watch your condition. °· Will get help right away if you are not doing well or get worse. °  °This information is not intended to replace advice given to you by your health care provider. Make sure you discuss any questions you have with your health care provider. °  °Document Released: 06/01/2008 Document Revised: 11/15/2011 Document Reviewed: 01/13/2011 °Elsevier Interactive Patient Education ©2016 [employer]. ° °
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
## 2015-10-02 ENCOUNTER — Emergency Department (HOSPITAL_COMMUNITY)
Admission: EM | Admit: 2015-10-02 | Discharge: 2015-10-02 | Disposition: A | Discharge status: Home / Self Care | Payer: Self-pay

## 2015-10-02 NOTE — ED Notes (Signed)
Pt was cursing and yelling at staff-[employer] speaking with pt, pt leaving at this time

## 2015-10-03 ENCOUNTER — Emergency Department (HOSPITAL_COMMUNITY): Payer: Self-pay

## 2015-10-03 ENCOUNTER — Emergency Department (HOSPITAL_COMMUNITY)
Admission: EM | Admit: 2015-10-03 | Discharge: 2015-10-03 | Disposition: A | Discharge status: Home / Self Care | Payer: Self-pay | Attending: Emergency Medicine | Admitting: Emergency Medicine

## 2015-10-03 ENCOUNTER — Encounter (HOSPITAL_COMMUNITY): Payer: Self-pay | Admitting: Emergency Medicine

## 2015-10-03 DIAGNOSIS — Y9389 Activity, other specified: Secondary | ICD-10-CM | POA: Insufficient documentation

## 2015-10-03 DIAGNOSIS — F17210 Nicotine dependence, cigarettes, uncomplicated: Secondary | ICD-10-CM | POA: Insufficient documentation

## 2015-10-03 DIAGNOSIS — Y999 Unspecified external cause status: Secondary | ICD-10-CM | POA: Insufficient documentation

## 2015-10-03 DIAGNOSIS — S99921A Unspecified injury of right foot, initial encounter: Secondary | ICD-10-CM | POA: Insufficient documentation

## 2015-10-03 DIAGNOSIS — Y9289 Other specified places as the place of occurrence of the external cause: Secondary | ICD-10-CM | POA: Insufficient documentation

## 2015-10-03 DIAGNOSIS — W1789XA Other fall from one level to another, initial encounter: Secondary | ICD-10-CM | POA: Insufficient documentation

## 2015-10-03 DIAGNOSIS — Z791 Long term (current) use of non-steroidal anti-inflammatories (NSAID): Secondary | ICD-10-CM | POA: Insufficient documentation

## 2015-10-03 MED ORDER — IBUPROFEN 800 MG PO TABS: 800.0000 mg | ORAL_TABLET | Freq: Three times a day (TID) | ORAL | Status: AC

## 2015-10-03 MED ORDER — OXYCODONE-ACETAMINOPHEN 5-325 MG PO TABS
1.0000 | ORAL_TABLET | Freq: Once | ORAL | Status: AC
  Administered 2015-10-03: 1 via ORAL
  Filled 2015-10-03: qty 1

## 2015-10-03 NOTE — ED Provider Notes (Signed)
CSN: [id number]     Arrival date & time 10/03/15  1002 History  By signing my name below, I, Iona Beard attest that this documentation has been prepared under the direction and in the presence of Eyvonne Mechanic, PA-C Electronically Signed: Iona Beard, ED Scribe 10/03/2015 at 10:54 AM.   Chief Complaint  Patient presents with  . Foot Injury   The history is provided by the patient. No language interpreter was used.   HPI Comments: Jeffrey Berg is a 40 y.o. male who presents to the Emergency Department complaining of sudden onset, constant right foot pain, onset after a fall from the a 2 foot porch yesterday. He states the pain is focused on his big toe but feels the pain throughout his foot. He is able to walk on his heel but is unable to put weight on his toes. No other worsening or alleviating factors noted. Pt took ibuprofen with no relief. Pt denies any other pertinent symptoms.   History reviewed. No pertinent past medical history. History reviewed. No pertinent past surgical history. No family history on file. Social History  Substance Use Topics  . Smoking status: Current Every Day Smoker -- 0.50 packs/day    Types: Cigarettes  . Smokeless tobacco: None  . Alcohol Use: Yes    Review of Systems  A complete 10 system review of systems was obtained and all systems are negative except as noted in the HPI and PMH.   Allergies  Review of patient's allergies indicates no known allergies.  Home Medications   Prior to Admission medications   Medication Sig Start Date End Date Taking? Authorizing Provider  cyclobenzaprine (FLEXERIL) 10 MG tablet Take 1 tablet (10 mg total) by mouth 2 (two) times daily as needed for muscle spasms. 06/19/15   Roxy Horseman, PA-C  diclofenac sodium (VOLTAREN) 1 % GEL Apply 1 application topically 4 (four) times daily. 09/12/15   Hayden Rasmussen, NP  ibuprofen (ADVIL,MOTRIN) 800 MG tablet Take 1 tablet (800 mg total) by mouth 3 (three) times  daily. 10/03/15   Eyvonne Mechanic, PA-C  meloxicam (MOBIC) 15 MG tablet Take 1 tablet (15 mg total) by mouth daily. 04/14/15   Tiffany [name], PA-C  naproxen (NAPROSYN) 375 MG tablet Take 1 tablet (375 mg total) by mouth 2 (two) times daily. 09/12/15   Hayden Rasmussen, NP  oxyCODONE-acetaminophen (PERCOCET/ROXICET) 5-325 MG tablet Take 2 tablets by mouth every 4 (four) hours as needed for severe pain. 09/25/15   Tharon Aquas, PA  oxyCODONE-acetaminophen (ROXICET) 5-325 MG tablet Take 1 tablet by mouth every 4 (four) hours as needed for severe pain. 09/20/15   Hope Orlene Och, NP   BP 132/97 mmHg  Pulse 78  Temp(Src) 98.2 F (36.8 C) (Oral)  Resp 20  SpO2 100% Physical Exam  Constitutional: He appears well-developed and well-nourished. No distress.  HENT:  Head: Normocephalic and atraumatic.  Eyes: Conjunctivae and EOM are normal.  Neck: Neck supple. No tracheal deviation present.  Cardiovascular: Normal rate.   Pulmonary/Chest: Effort normal. No respiratory distress.  Musculoskeletal: Normal range of motion.  Swelling to the right graeat toe at MTP. Tenderness to first tarsal with swelling. No medial or lateral maleolus TTP. No swelling to ankle, distal  Decreased ROM to MTP due to pain. Sensation intact. Pedal pulse 2+.   Neurological: He is alert.  Skin: Skin is warm and dry.  Psychiatric: He has a normal mood and affect. His behavior is normal.   ED Course  Procedures (including critical care)  DIAGNOSTIC STUDIES: Oxygen Saturation is 100% on RA, normal by my interpretation.    COORDINATION OF CARE: 10:49 AM-Discussed treatment plan which includes DG foot and pain medication with pt at bedside and pt agreed to plan.   Labs Review Labs Reviewed - No data to display  Imaging Review Dg Foot Complete Right  10/03/2015  CLINICAL DATA:  Pain following fall from porch EXAM: RIGHT FOOT COMPLETE - 3+ VIEW COMPARISON:  None. FINDINGS: Frontal, oblique, and lateral views were obtained. There  is soft tissue swelling medially. There is no demonstrable fracture or dislocation. The joint spaces appear normal. No erosive change. IMPRESSION: Soft tissue swelling medially. No fracture or dislocation. No appreciable arthropathy. Electronically Signed   By: Bretta Bang III M.D.   On: 10/03/2015 10:42   I have personally reviewed and evaluated these images and lab results as part of my medical decision-making.   EKG Interpretation None      MDM   Final diagnoses:  Toe injury, right, initial encounter    Labs:   Imaging: DG Right Foot   Consults:   Therapeutics:   Discharge Meds:   Assessment/Plan: Patient presents with sprain to his MTP of the right foot. No soft tissue injury, no findings on diagnostic imaging. Patient be put in a postop boot, instructed to elevate, ice, ibuprofen, follow-up with primary care symptoms continue to persist. Patient given strict return precautions, verbalized understanding and agreement for today's plan.     I personally performed the services described in this documentation, which was scribed in my presence. The recorded information has been reviewed and is accurate.    Eyvonne Mechanic, PA-C 10/03/15 1412  Arby Barrette, MD 10/04/15 [phone]
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
## 2015-10-03 NOTE — Discharge Instructions (Signed)
Please read attached information. If you experience any new or worsening signs or symptoms please return to the emergency room for evaluation. Please follow-up with your primary care provider or specialist as discussed. Please use medication prescribed only as directed and discontinue taking if you have any concerning signs or symptoms.   °

## 2015-12-08 ENCOUNTER — Emergency Department (HOSPITAL_COMMUNITY)
Admission: EM | Admit: 2015-12-08 | Discharge: 2015-12-08 | Disposition: A | Discharge status: Home / Self Care | Payer: Self-pay | Attending: Emergency Medicine | Admitting: Emergency Medicine

## 2015-12-08 ENCOUNTER — Encounter (HOSPITAL_COMMUNITY): Payer: Self-pay | Admitting: *Deleted

## 2015-12-08 DIAGNOSIS — F17210 Nicotine dependence, cigarettes, uncomplicated: Secondary | ICD-10-CM | POA: Insufficient documentation

## 2015-12-08 DIAGNOSIS — Z791 Long term (current) use of non-steroidal anti-inflammatories (NSAID): Secondary | ICD-10-CM | POA: Insufficient documentation

## 2015-12-08 DIAGNOSIS — Z79899 Other long term (current) drug therapy: Secondary | ICD-10-CM | POA: Insufficient documentation

## 2015-12-08 DIAGNOSIS — Z0289 Encounter for other administrative examinations: Secondary | ICD-10-CM | POA: Insufficient documentation

## 2015-12-08 NOTE — Discharge Instructions (Signed)
Work note

## 2015-12-08 NOTE — ED Provider Notes (Signed)
CSN: [id number]     Arrival date & time 12/08/15  0957 History  By signing my name below, I, Tanda RockersMargaux Venter, attest that this documentation has been prepared under the direction and in the presence of Federated Department StoresHanna Patel-Mills, PA-C. Electronically Signed: Tanda RockersMargaux Venter, ED Scribe. 12/08/2015. 11:07 AM.   Chief Complaint  Patient presents with  . Letter for School/Work   The history is provided by the patient. No language interpreter was used.     HPI Comments: Jeffrey Berg is a 40 y.o. male who presents to the Emergency Department for a work note. Pt reports that he noticed an abscess to the right axilla approximately 6 days ago. He was sent home from work for 2 days for the abscess and was told that he needs to have a work note to clear him to go back to work. The abscess drained over the weekend and he is currently asymptomatic. Denies fever, chills, or any other associated symptoms.   History reviewed. No pertinent past medical history. History reviewed. No pertinent past surgical history. History reviewed. No pertinent family history. Social History  Substance Use Topics  . Smoking status: Current Every Day Smoker -- 0.50 packs/day    Types: Cigarettes  . Smokeless tobacco: None  . Alcohol Use: Yes    Review of Systems  Constitutional: Negative for fever and chills.  Musculoskeletal: Negative for arthralgias.   Allergies  Review of patient's allergies indicates no known allergies.  Home Medications   Prior to Admission medications   Medication Sig Start Date End Date Taking? Authorizing Provider  cyclobenzaprine (FLEXERIL) 10 MG tablet Take 1 tablet (10 mg total) by mouth 2 (two) times daily as needed for muscle spasms. 06/19/15  Yes Roxy Horsemanobert Browning, PA-C  diclofenac sodium (VOLTAREN) 1 % GEL Apply 1 application topically 4 (four) times daily. 09/12/15  Yes Hayden Rasmussenavid Mabe, NP  ibuprofen (ADVIL,MOTRIN) 800 MG tablet Take 1 tablet (800 mg total) by mouth 3 (three) times daily. 10/03/15   Yes Jeffrey Hedges, PA-C  meloxicam (MOBIC) 15 MG tablet Take 1 tablet (15 mg total) by mouth daily. 04/14/15  Yes Tiffany Neva SeatGreene, PA-C  naproxen (NAPROSYN) 375 MG tablet Take 1 tablet (375 mg total) by mouth 2 (two) times daily. 09/12/15  Yes Hayden Rasmussenavid Mabe, NP  oxyCODONE-acetaminophen (PERCOCET/ROXICET) 5-325 MG tablet Take 2 tablets by mouth every 4 (four) hours as needed for severe pain. 09/25/15  Yes Tharon AquasFrank C Patrick, PA  oxyCODONE-acetaminophen (ROXICET) 5-325 MG tablet Take 1 tablet by mouth every 4 (four) hours as needed for severe pain. 09/20/15  Yes Hope Orlene OchM Neese, NP   BP 129/80 mmHg  Pulse 91  Temp(Src) 98.2 F (36.8 C) (Oral)  Resp 16  Ht 5\' 7"  (1.702 m)  Wt 91.627 kg  BMI 31.63 kg/m2  SpO2 98%   Physical Exam  Constitutional: He is oriented to person, place, and time. He appears well-developed and well-nourished. No distress.  HENT:  Head: Normocephalic and atraumatic.  Eyes: Conjunctivae and EOM are normal.  Neck: Neck supple. No tracheal deviation present.  Cardiovascular: Normal rate.   Pulmonary/Chest: Effort normal. No respiratory distress.  Musculoskeletal: Normal range of motion.  Neurological: He is alert and oriented to person, place, and time.  Skin: Skin is warm and dry.  Residual scar in the right axilla from previous abscess without signs of infection or redness.   Psychiatric: He has a normal mood and affect. His behavior is normal.  Nursing note and vitals reviewed.   ED Course  Procedures (  including critical care time)  DIAGNOSTIC STUDIES: Oxygen Saturation is 98% on RA, normal by my interpretation.    COORDINATION OF CARE: 11:06 AM-Discussed treatment plan which includes work note with pt at bedside and pt agreed to plan.   Labs Review Labs Reviewed - No data to display  Imaging Review No results found.   EKG Interpretation None      MDM   Final diagnoses:  Encounter to obtain excuse from work   Patient presents for work note. States he  stayed home because he had an abscess in his right axilla which he drained himself. Abscess does not appear infected and patient is afebrile. I discussed I would give him a work for today only. Patient agreed with plan. I personally performed the services described in this documentation, which was scribed in my presence. The recorded information has been reviewed and is accurate.      Catha Gosselin, PA-C 12/09/15 1121  Laurence Spates, MD 12/10/15 [phone]
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
## 2015-12-08 NOTE — ED Notes (Signed)
Declined W/C at D/C and was escorted to lobby by RN. 
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
